# Patient Record
Sex: Female | Born: 1990 | Race: Black or African American | Hispanic: No | Marital: Single | Smoking: Never smoker
Health system: Southern US, Community
[De-identification: ages and names within clinical notes are randomized; demographics above are authoritative.]

---

## 2014-05-20 ENCOUNTER — Ambulatory Visit (INDEPENDENT_AMBULATORY_CARE_PROVIDER_SITE_OTHER): Payer: PRIVATE HEALTH INSURANCE | Admitting: Sports Medicine

## 2014-05-20 ENCOUNTER — Encounter: Payer: Self-pay | Admitting: Sports Medicine

## 2014-05-20 VITALS — BP 95/63 | HR 78 | Ht 71.0 in | Wt 210.0 lb

## 2014-05-20 DIAGNOSIS — M76829 Posterior tibial tendinitis, unspecified leg: Secondary | ICD-10-CM | POA: Insufficient documentation

## 2014-05-20 DIAGNOSIS — L505 Cholinergic urticaria: Secondary | ICD-10-CM | POA: Diagnosis not present

## 2014-05-20 DIAGNOSIS — M7631 Iliotibial band syndrome, right leg: Secondary | ICD-10-CM | POA: Diagnosis not present

## 2014-05-20 DIAGNOSIS — M2580 Other specified joint disorders, unspecified joint: Secondary | ICD-10-CM | POA: Diagnosis not present

## 2014-05-20 DIAGNOSIS — M6789 Other specified disorders of synovium and tendon, multiple sites: Secondary | ICD-10-CM

## 2014-05-20 DIAGNOSIS — M7630 Iliotibial band syndrome, unspecified leg: Secondary | ICD-10-CM | POA: Insufficient documentation

## 2014-05-20 NOTE — Progress Notes (Signed)
Lindsey PikesMedena Barraclough - 23 y.o. female MRN [id number]  Date of birth: 06/20/1991  CC & HPI:  [facility] for evaluation of: Bilateral (L>R) Foot Pain: Left greater than right medial ball of the foot over the sesamoid. Duration greater than 2 years but worsened over the past month since resuming basketball. No specific injury. Moderate to severe pain causing limping at home..  Denies numbness, tingling or significant weakness. She has tried Modified over-the-counter orthotics , therapeutic exercise, NSAIDs (now off due to rash) without significant relief. She presents today for fabrication of custom orthotics.  Right Knee Pain: Right lateral knee pain. 3 days, no injury "locked up after practice where we had to do multiple sprint repeats". Pain with palpation over the lateral aspect nonradiating. Bending and straightening her knee is painful..  Denies clicking, locking, giving way. Ice and therapeutic exercises and training room.  Skin Rash:  diffuse rash associated with heat exposure and exercise especially in warm environments. Has been treated throughout the season with Allegra, Singulair, H2 blocker. No known food triggers. Has recently been started on scopolamine patch. She's on been able to wear this for a day and reports overall less tingling sensation throughout the day with some improvement in symptoms during practice today but not sure if truly helpful at this point. No respiratory involvement in the past. No fevers, chills or weight loss. She has tried stopping anti-inflammatories which has not had an effect and above other medications do not seem to significantly help. No family history.  ROS:  Per HPI.   HISTORY: Past Medical, Surgical, Social, and Family History Reviewed & Updated per EMR.  Pertinent Historical Findings include:  otherwise healthy  [employer] basketball player    OBJECTIVE FINDINGS:  VS:   HT:5\' 11"  (180.3 cm)   WT:210 lb (95.255 kg)  BMI:29.4        BP:95/63 mmHg  HR:78bpm  TEMP: ( )  RESP:   PHYSICAL EXAM: GENERAL: adult athletic African American female. In no discomfort; no respiratory distress   PSYCH: alert and appropriate, good insight   NEURO: Sensation is intact to light touch in lower extremities   VASCULAR: No significant edema.  Skin:  no significant rash or urticarial lesions at this time. Scopolamine patch in place and right ear     Bilateral foot EXAM: Appearance:  overall normal-appearing. Collapse of longitudinal arch of the left foot with weightbearing. First toenail with small out of ecchymosis at the base.   Palpation: TTP over: Plantar aspect of the MCP joint over the sesamoid  No TTP over: Posterior tibialis or mid foot   Strength & ROM: 5/5 Strength and full active ROM in: Plantar flexion, dorsiflexion, inversion and eversion   Special Tests:  evidence of posterior tibialis insufficiency with repeat heel lifts. Calcaneal valgus at rest left greater than right    Right Knee EXAM: Appearance:  overall normal-appearing, normal alignment, no significant effusion.   Skin: No overlying erythema/ecchymosis.  Palpation: TTP over: Lateral femoral condyle increased tubercle.  No TTP over: Medial or lateral joint lines, patellar tendon.   Strength & ROM: 5/5 Strength and full active ROM in: Knee extension , knee flexion(slight lateral pain) Weakness in: Right hip abduction   Special Tests:  positive notable test. Ligamentously stable, normal McMurray's      ASSESSMENT: 1. Sesamoiditis   2. Posterior tibialis tendon insufficiency   3. Iliotibial band syndrome, right   4. Cholinergic urticaria    PROCEDURE NOTE: CUSTOM ORTHOTICS  The patient was fitted for a standard, cushioned, semi-rigid orthotic. The orthotic was heated & placed on the orthotic stand. The patient was positioned in subtalar neutral position and 10 of ankle dorsiflexion and weight bearing stance some heated orthotic blank. After completion of  the molding a stable paste was applied to the orthotic blank. The orthotic was ground to a stable position for weightbearing. The patient ambulated in these and reported they were comfortable without pressure spots.            BLANK:  Size 10 - Standard Cushioned             BASE:  Blue EVA      POSTINGS:  Sesmoid pad on Left + additional blue padding on entire left forefoot >50% of this 50 minute visit was spent in direct face to face evaluation, measurement and manufacture of custom molded orthotic.   PLAN: See problem based charting & AVS for additional documentation. - Custom Orthotics today with corrections as above. Reports improvement and discomfort and overall comfort orthotic  - Therapy exercises for left posterior tib def and [facility] ITB [facility]. - Continue Scopolamine patch and note effect with treatment. Consider Hyocyamine pre-activity/exposure given covered by insurance but concerned for sedative effects. Could consider withdrawal of Singulair as she reports no improvement on this. Also could consider transitioning from Allegra to Zyrtec twice a day (Zyrtec is derivative of Hydroxizine).  Additional last line treatment prior to referall to allergist would be qhs doxepin. Continue to watch for side effects/sedation. [facility].

## 2014-07-21 ENCOUNTER — Ambulatory Visit
Admission: RE | Admit: 2014-07-21 | Discharge: 2014-07-21 | Disposition: A | Discharge status: Home / Self Care | Payer: 59 | Source: Ambulatory Visit | Attending: Sports Medicine | Admitting: Sports Medicine

## 2014-07-21 ENCOUNTER — Telehealth: Payer: Self-pay | Admitting: Sports Medicine

## 2014-07-21 DIAGNOSIS — M545 Low back pain, unspecified: Secondary | ICD-10-CM

## 2014-07-21 NOTE — Telephone Encounter (Signed)
[facility]. [facility] after hours.

## 2014-09-29 ENCOUNTER — Ambulatory Visit (INDEPENDENT_AMBULATORY_CARE_PROVIDER_SITE_OTHER): Payer: 59 | Admitting: Women's Health

## 2014-09-29 ENCOUNTER — Other Ambulatory Visit (HOSPITAL_COMMUNITY)
Admission: RE | Admit: 2014-09-29 | Discharge: 2014-09-29 | Disposition: A | Discharge status: Home / Self Care | Payer: 59 | Source: Ambulatory Visit | Attending: Gynecology | Admitting: Gynecology

## 2014-09-29 ENCOUNTER — Encounter: Payer: Self-pay | Admitting: Women's Health

## 2014-09-29 VITALS — BP 126/80 | Ht 71.0 in | Wt 218.0 lb

## 2014-09-29 DIAGNOSIS — Z01411 Encounter for gynecological examination (general) (routine) with abnormal findings: Secondary | ICD-10-CM | POA: Insufficient documentation

## 2014-09-29 DIAGNOSIS — N926 Irregular menstruation, unspecified: Secondary | ICD-10-CM

## 2014-09-29 DIAGNOSIS — Z01419 Encounter for gynecological examination (general) (routine) without abnormal findings: Secondary | ICD-10-CM

## 2014-09-29 LAB — CBC WITH DIFFERENTIAL/PLATELET
BASOS ABS: 0 10*3/uL (ref 0.0–0.1)
BASOS PCT: 1 % (ref 0–1)
EOS ABS: 0.1 10*3/uL (ref 0.0–0.7)
Eosinophils Relative: 2 % (ref 0–5)
HEMATOCRIT: 39.3 % (ref 36.0–46.0)
Hemoglobin: 12.7 g/dL (ref 12.0–15.0)
Lymphocytes Relative: 44 % (ref 12–46)
Lymphs Abs: 1.8 10*3/uL (ref 0.7–4.0)
MCH: 28 pg (ref 26.0–34.0)
MCHC: 32.3 g/dL (ref 30.0–36.0)
MCV: 86.8 fL (ref 78.0–100.0)
MONO ABS: 0.5 10*3/uL (ref 0.1–1.0)
MPV: 9.3 fL (ref 8.6–12.4)
Monocytes Relative: 11 % (ref 3–12)
NEUTROS ABS: 1.7 10*3/uL (ref 1.7–7.7)
Neutrophils Relative %: 42 % — ABNORMAL LOW (ref 43–77)
PLATELETS: 258 10*3/uL (ref 150–400)
RBC: 4.53 MIL/uL (ref 3.87–5.11)
RDW: 14.6 % (ref 11.5–15.5)
WBC: 4.1 10*3/uL (ref 4.0–10.5)

## 2014-09-29 LAB — TSH: TSH: 1.934 u[IU]/mL (ref 0.350–4.500)

## 2014-09-29 NOTE — Progress Notes (Signed)
Lindsey PikesMedena Yoder 08/06/1991 [id number]    History:    Presents for annual exam.  Lesbian/same partner years/never female partner. Cycles every 2-3 months. Completed gardasil in high school.  Past medical history, past surgical history, family history and social history were all reviewed and documented in the EPIC chart. [employer] [place] athlete from [place]. Graduating in May planning to join [employer]. Parents healthy, grandmother diabetes.  ROS:  A ROS was performed and pertinent positives and negatives are included.  Exam:  Filed Vitals:   09/29/14 0919  BP: 126/80    General appearance:  Normal Thyroid:  Symmetrical, normal in size, without palpable masses or nodularity. Respiratory  Auscultation:  Clear without wheezing or rhonchi Cardiovascular  Auscultation:  Regular rate, without rubs, murmurs or gallops  Edema/varicosities:  Not grossly evident Abdominal  Soft,nontender, without masses, guarding or rebound.  Liver/spleen:  No organomegaly noted  Hernia:  None appreciated  Skin  Inspection:  Grossly normal   Breasts: Examined lying and sitting.     Right: Without masses, retractions, discharge or axillary adenopathy.     Left: Without masses, retractions, discharge or axillary adenopathy. Gentitourinary   Inguinal/mons:  Normal without inguinal adenopathy  External genitalia:  Normal  BUS/Urethra/Skene's glands:  Normal  Vagina:  Normal  Cervix:  Normal  Uterus:  normal in size, shape and contour.  Midline and mobile  Adnexa/parametria:     Rt: Without masses or tenderness.   Lt: Without masses or tenderness.  Anus and perineum: Normal  Digital rectal exam: Normal sphincter tone without palpated masses or tenderness  Assessment/Plan:  24 y.o. SBF G0 lesbian for annual exam.     Irregular cycles every 2-3 months Athlete  Plan: CBC, TSH, prolactin, UA, Pap. Options reviewed for irregular cycles, cycling with OCs declines. Reviewed irregular cycles may be  part of athletic triad. SBE's, continue regular exercise, healthy diet, calcium rich foods, MVI daily encouraged. Campus safety reviewed.   Harrington ChallengerYOUNG,NANCY J Adventhealth SebringWHNP, 10:29 AM 09/29/2014

## 2014-09-29 NOTE — Patient Instructions (Signed)
Health Maintenance Adopting a healthy lifestyle and getting preventive care can go a long way to promote health and wellness. Talk with your health care provider about what schedule of regular examinations is right for you. This is a good chance for you to check in with your provider about disease prevention and staying healthy. In between checkups, there are plenty of things you can do on your own. Experts have done a lot of research about which lifestyle changes and preventive measures are most likely to keep you healthy. Ask your health care provider for more information. WEIGHT AND DIET  Eat a healthy diet  Be sure to include plenty of vegetables, fruits, low-fat dairy products, and lean protein.  Do not eat a lot of foods high in solid fats, added sugars, or salt.  Get regular exercise. This is one of the most important things you can do for your health.  Most adults should exercise for at least 150 minutes each week. The exercise should increase your heart rate and make you sweat (moderate-intensity exercise).  Most adults should also do strengthening exercises at least twice a week. This is in addition to the moderate-intensity exercise.  Maintain a healthy weight  Body mass index (BMI) is a measurement that can be used to identify possible weight problems. It estimates body fat based on height and weight. Your health care provider can help determine your BMI and help you achieve or maintain a healthy weight.  For females 25 years of age and older:   A BMI below 18.5 is considered underweight.  A BMI of 18.5 to 24.9 is normal.  A BMI of 25 to 29.9 is considered overweight.  A BMI of 30 and above is considered obese.  Watch levels of cholesterol and blood lipids  You should start having your blood tested for lipids and cholesterol at 23 years of age, then have this test every 5 years.  You may need to have your cholesterol levels checked more often if:  Your lipid or  cholesterol levels are high.  You are older than 23 years of age.  You are at high risk for heart disease.  CANCER SCREENING   Lung Cancer  Lung cancer screening is recommended for adults 97-92 years old who are at high risk for lung cancer because of a history of smoking.  A yearly low-dose CT scan of the lungs is recommended for people who:  Currently smoke.  Have quit within the past 15 years.  Have at least a 30-pack-year history of smoking. A pack year is smoking an average of one pack of cigarettes a day for 1 year.  Yearly screening should continue until it has been 15 years since you quit.  Yearly screening should stop if you develop a health problem that would prevent you from having lung cancer treatment.  Breast Cancer  Practice breast self-awareness. This means understanding how your breasts normally appear and feel.  It also means doing regular breast self-exams. Let your health care provider know about any changes, no matter how small.  If you are in your 20s or 30s, you should have a clinical breast exam (CBE) by a health care provider every 1-3 years as part of a regular health exam.  If you are 76 or older, have a CBE every year. Also consider having a breast X-ray (mammogram) every year.  If you have a family history of breast cancer, talk to your health care provider about genetic screening.  If you are  at high risk for breast cancer, talk to your health care provider about having an MRI and a mammogram every year.  Breast cancer gene (BRCA) assessment is recommended for women who have family members with BRCA-related cancers. BRCA-related cancers include:  Breast.  Ovarian.  Tubal.  Peritoneal cancers.  Results of the assessment will determine the need for genetic counseling and BRCA1 and BRCA2 testing. Cervical Cancer Routine pelvic examinations to screen for cervical cancer are no longer recommended for nonpregnant women who are considered low  risk for cancer of the pelvic organs (ovaries, uterus, and vagina) and who do not have symptoms. A pelvic examination may be necessary if you have symptoms including those associated with pelvic infections. Ask your health care provider if a screening pelvic exam is right for you.   The Pap test is the screening test for cervical cancer for women who are considered at risk.  If you had a hysterectomy for a problem that was not cancer or a condition that could lead to cancer, then you no longer need Pap tests.  If you are older than 65 years, and you have had normal Pap tests for the past 10 years, you no longer need to have Pap tests.  If you have had past treatment for cervical cancer or a condition that could lead to cancer, you need Pap tests and screening for cancer for at least 20 years after your treatment.  If you no longer get a Pap test, assess your risk factors if they change (such as having a new sexual partner). This can affect whether you should start being screened again.  Some women have medical problems that increase their chance of getting cervical cancer. If this is the case for you, your health care provider may recommend more frequent screening and Pap tests.  The human papillomavirus (HPV) test is another test that may be used for cervical cancer screening. The HPV test looks for the virus that can cause cell changes in the cervix. The cells collected during the Pap test can be tested for HPV.  The HPV test can be used to screen women 30 years of age and older. Getting tested for HPV can extend the interval between normal Pap tests from three to five years.  An HPV test also should be used to screen women of any age who have unclear Pap test results.  After 23 years of age, women should have HPV testing as often as Pap tests.  Colorectal Cancer  This type of cancer can be detected and often prevented.  Routine colorectal cancer screening usually begins at 23 years of  age and continues through 23 years of age.  Your health care provider may recommend screening at an earlier age if you have risk factors for colon cancer.  Your health care provider may also recommend using home test kits to check for hidden blood in the stool.  A small camera at the end of a tube can be used to examine your colon directly (sigmoidoscopy or colonoscopy). This is done to check for the earliest forms of colorectal cancer.  Routine screening usually begins at age 50.  Direct examination of the colon should be repeated every 5-10 years through 23 years of age. However, you may need to be screened more often if early forms of precancerous polyps or small growths are found. Skin Cancer  Check your skin from head to toe regularly.  Tell your health care provider about any new moles or changes in   moles, especially if there is a change in a mole's shape or color.  Also tell your health care provider if you have a mole that is larger than the size of a pencil eraser.  Always use sunscreen. Apply sunscreen liberally and repeatedly throughout the day.  Protect yourself by wearing long sleeves, pants, a wide-brimmed hat, and sunglasses whenever you are outside. HEART DISEASE, DIABETES, AND HIGH BLOOD PRESSURE   Have your blood pressure checked at least every 1-2 years. High blood pressure causes heart disease and increases the risk of stroke.  If you are between 75 years and 42 years old, ask your health care provider if you should take aspirin to prevent strokes.  Have regular diabetes screenings. This involves taking a blood sample to check your fasting blood sugar level.  If you are at a normal weight and have a low risk for diabetes, have this test once every three years after 23 years of age.  If you are overweight and have a high risk for diabetes, consider being tested at a younger age or more often. PREVENTING INFECTION  Hepatitis B  If you have a higher risk for  hepatitis B, you should be screened for this virus. You are considered at high risk for hepatitis B if:  You were born in a country where hepatitis B is common. Ask your health care provider which countries are considered high risk.  Your parents were born in a high-risk country, and you have not been immunized against hepatitis B (hepatitis B vaccine).  You have HIV or AIDS.  You use needles to inject street drugs.  You live with someone who has hepatitis B.  You have had sex with someone who has hepatitis B.  You get hemodialysis treatment.  You take certain medicines for conditions, including cancer, organ transplantation, and autoimmune conditions. Hepatitis C  Blood testing is recommended for:  Everyone born from [birth year] through 1964.  Anyone with known risk factors for hepatitis C. Sexually transmitted infections (STIs)  You should be screened for sexually transmitted infections (STIs) including gonorrhea and chlamydia if:  You are sexually active and are younger than 23 years of age.  You are older than 23 years of age and your health care provider tells you that you are at risk for this type of infection.  Your sexual activity has changed since you were last screened and you are at an increased risk for chlamydia or gonorrhea. Ask your health care provider if you are at risk.  If you do not have HIV, but are at risk, it may be recommended that you take a prescription medicine daily to prevent HIV infection. This is called pre-exposure prophylaxis (PrEP). You are considered at risk if:  You are sexually active and do not regularly use condoms or know the HIV status of your partner(s).  You take drugs by injection.  You are sexually active with a partner who has HIV. Talk with your health care provider about whether you are at high risk of being infected with HIV. If you choose to begin PrEP, you should first be tested for HIV. You should then be tested every 3 months for  as long as you are taking PrEP.  PREGNANCY   If you are premenopausal and you may become pregnant, ask your health care provider about preconception counseling.  If you may become pregnant, take 400 to 800 micrograms (mcg) of folic acid every day.  If you want to prevent pregnancy, talk to your  health care provider about birth control (contraception). OSTEOPOROSIS AND MENOPAUSE   Osteoporosis is a disease in which the bones lose minerals and strength with aging. This can result in serious bone fractures. Your risk for osteoporosis can be identified using a bone density scan.  If you are 80 years of age or older, or if you are at risk for osteoporosis and fractures, ask your health care provider if you should be screened.  Ask your health care provider whether you should take a calcium or vitamin D supplement to lower your risk for osteoporosis.  Menopause may have certain physical symptoms and risks.  Hormone replacement therapy may reduce some of these symptoms and risks. Talk to your health care provider about whether hormone replacement therapy is right for you.  HOME CARE INSTRUCTIONS   Schedule regular health, dental, and eye exams.  Stay current with your immunizations.   Do not use any tobacco products including cigarettes, chewing tobacco, or electronic cigarettes.  If you are pregnant, do not drink alcohol.  If you are breastfeeding, limit how much and how often you drink alcohol.  Limit alcohol intake to no more than 1 drink per day for nonpregnant women. One drink equals 12 ounces of beer, 5 ounces of wine, or 1 ounces of hard liquor.  Do not use street drugs.  Do not share needles.  Ask your health care provider for help if you need support or information about quitting drugs.  Tell your health care provider if you often feel depressed.  Tell your health care provider if you have ever been abused or do not feel safe at home. Document Released: 02/06/2011  Document Revised: 12/08/2013 Document Reviewed: 06/25/2013 [facility] Patient Information 2015 [place], [place]. This information is not intended to replace advice given to you by your health care provider. Make sure you discuss any questions you have with your health care provider. Polycystic Ovarian Syndrome Polycystic ovarian syndrome (PCOS) is a common hormonal disorder among women of reproductive age. Most women with PCOS grow many small cysts on their ovaries. PCOS can cause problems with your periods and make it difficult to get pregnant. It can also cause an increased risk of miscarriage with pregnancy. If left untreated, PCOS can lead to serious health problems, such as diabetes and heart disease. CAUSES The cause of PCOS is not fully understood, but genetics may be a factor. SIGNS AND SYMPTOMS   Infrequent or no menstrual periods.   Inability to get pregnant (infertility) because of not ovulating.   Increased growth of hair on the face, chest, stomach, back, thumbs, thighs, or toes.   Acne, oily skin, or dandruff.   Pelvic pain.   Weight gain or obesity, usually carrying extra weight around the waist.   Type 2 diabetes.   High cholesterol.   High blood pressure.   Female-pattern baldness or thinning hair.   Patches of thickened and dark brown or black skin on the neck, arms, breasts, or thighs.   Tiny excess flaps of skin (skin tags) in the armpits or neck area.   Excessive snoring and having breathing stop at times while asleep (sleep apnea).   Deepening of the voice.   Gestational diabetes when pregnant.  DIAGNOSIS  There is no single test to diagnose PCOS.   Your health care provider will:   Take a medical history.   Perform a pelvic exam.   Have ultrasonography done.   Check your female and female hormone levels.   Measure glucose or sugar  levels in the blood.   Do other blood tests.   If you are producing too many female hormones,  your health care provider will make sure it is from PCOS. At the physical exam, your health care provider will want to evaluate the areas of increased hair growth. Try to allow natural hair growth for a few days before the visit.   During a pelvic exam, the ovaries may be enlarged or swollen because of the increased number of small cysts. This can be seen more easily by using vaginal ultrasonography or screening to examine the ovaries and lining of the uterus (endometrium) for cysts. The uterine lining may become thicker if you have not been having a regular period.  TREATMENT  Because there is no cure for PCOS, it needs to be managed to prevent problems. Treatments are based on your symptoms. Treatment is also based on whether you want to have a baby or whether you need contraception.  Treatment may include:   Progesterone hormone to start a menstrual period.   Birth control pills to make you have regular menstrual periods.   Medicines to make you ovulate, if you want to get pregnant.   Medicines to control your insulin.   Medicine to control your blood pressure.   Medicine and diet to control your high cholesterol and triglycerides in your blood.  Medicine to reduce excessive hair growth.  Surgery, making small holes in the ovary, to decrease the amount of female hormone production. This is done through a long, lighted tube (laparoscope) placed into the pelvis through a tiny incision in the lower abdomen.  HOME CARE INSTRUCTIONS  Only take over-the-counter or prescription medicine as directed by your health care provider.  Pay attention to the foods you eat and your activity levels. This can help reduce the effects of PCOS.  Keep your weight under control.  Eat foods that are low in carbohydrate and high in fiber.  Exercise regularly. SEEK MEDICAL CARE IF:  Your symptoms do not get better with medicine.  You have new symptoms. Document Released: 11/17/2004 Document  Revised: 05/14/2013 Document Reviewed: 01/09/2013 [facility] Patient Information 2015 [place], [place]. This information is not intended to replace advice given to you by your health care provider. Make sure you discuss any questions you have with your health care provider.

## 2014-09-30 LAB — CYTOLOGY - PAP

## 2014-09-30 LAB — PROLACTIN: PROLACTIN: 9.7 ng/mL

## 2014-10-05 ENCOUNTER — Other Ambulatory Visit: Payer: Self-pay | Admitting: Orthopedic Surgery

## 2014-10-05 DIAGNOSIS — M25561 Pain in right knee: Secondary | ICD-10-CM

## 2014-10-12 ENCOUNTER — Other Ambulatory Visit: Payer: 59

## 2014-10-19 ENCOUNTER — Other Ambulatory Visit: Payer: 59

## 2014-10-20 ENCOUNTER — Ambulatory Visit
Admission: RE | Admit: 2014-10-20 | Discharge: 2014-10-20 | Disposition: A | Discharge status: Home / Self Care | Payer: 59 | Source: Ambulatory Visit | Attending: Orthopedic Surgery | Admitting: Orthopedic Surgery

## 2014-10-20 DIAGNOSIS — M25561 Pain in right knee: Secondary | ICD-10-CM

## 2016-07-10 IMAGING — MR MR KNEE*R* W/O CM
5 of 6 series · 30 of 40 positions shown · non-contrast
Comparison: None.

CLINICAL DATA: Basketball injury right knee 1 month ago with
continued lateral knee pain. Question meniscal tear.

EXAM:
MRI OF THE RIGHT KNEE WITHOUT CONTRAST
TECHNIQUE: Multiplanar, multisequence MR imaging of the knee was performed. No
intravenous contrast was administered.

[Series 3: PD · axial · 4.0mm · 0.62mm/px · z∈[-44,+66]mm · 9 of 24 slices shown (1 of 2)]
[im 1/24]
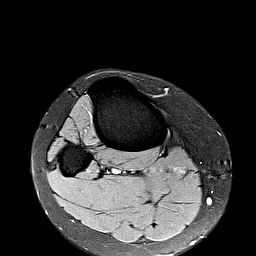
[im 3/24]
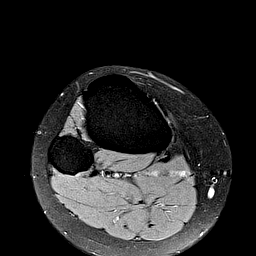
[im 6/24]
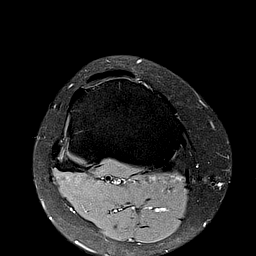
[im 9/24]
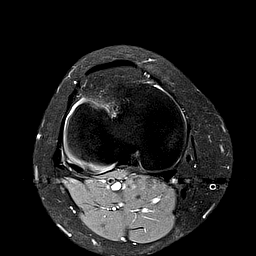
[im 12/24]
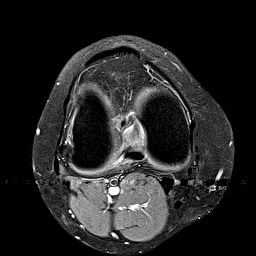
[im 15/24]
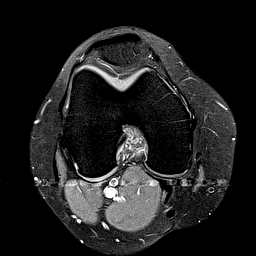
[im 18/24]
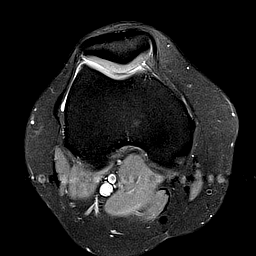
[im 21/24]
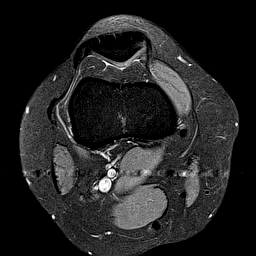
[im 24/24]
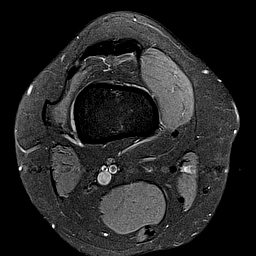

[Series 4: PD · coronal · 4.0mm · 0.33mm/px · 6 of 20 slices shown (2 of 2)]
[im 1/20]
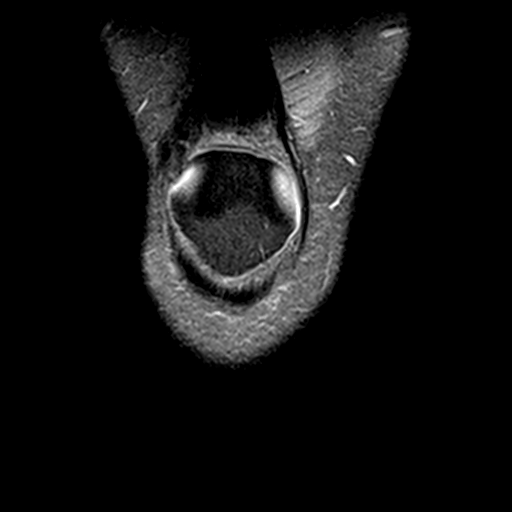
[im 4/20]
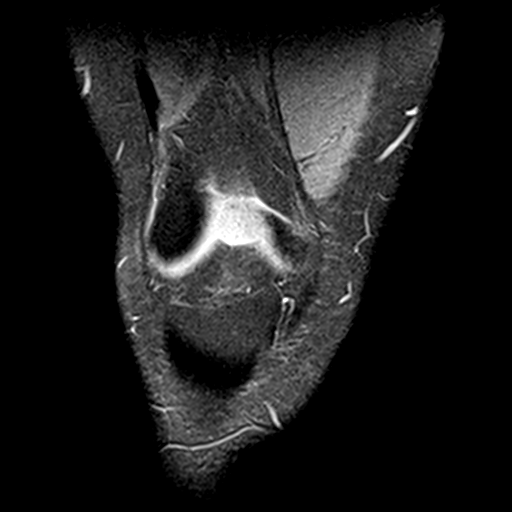
[im 8/20]
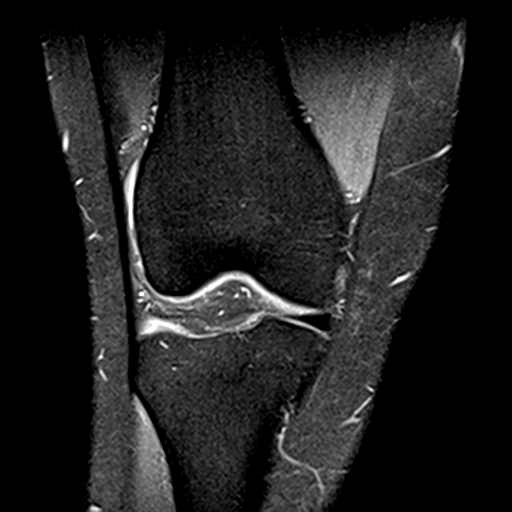
[im 12/20]
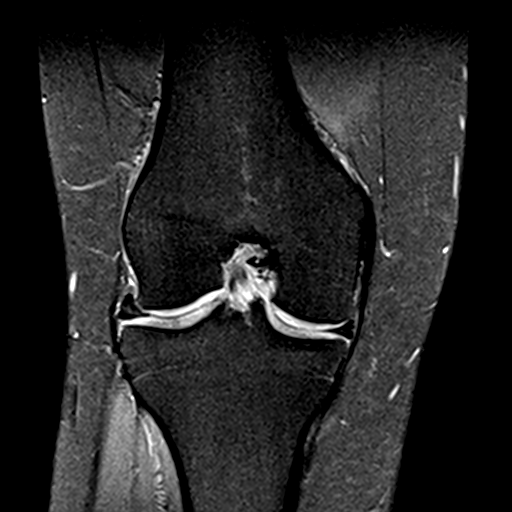
[im 16/20]
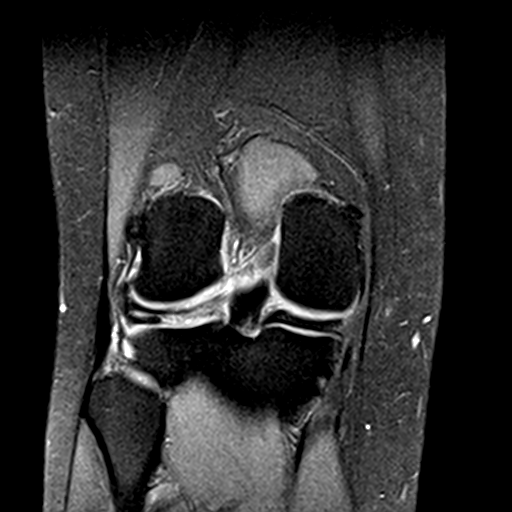
[im 20/20]
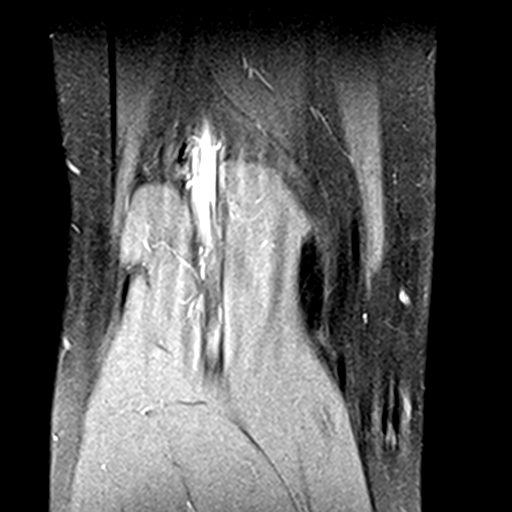

[Series 5: PD fat-sat · sagittal · 4.0mm · 0.62mm/px · 7 of 22 slices shown]
[im 1/22]
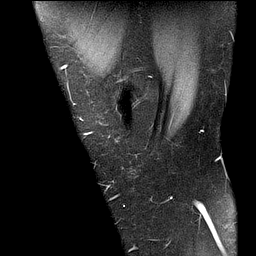
[im 4/22]
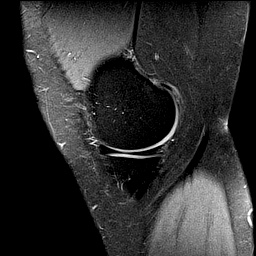
[im 8/22]
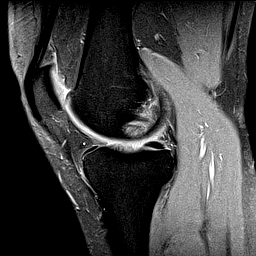
[im 11/22]
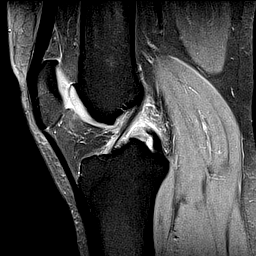
[im 15/22]
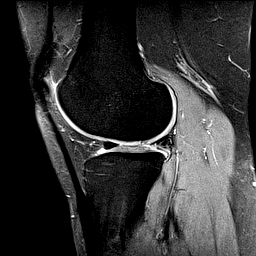
[im 18/22]
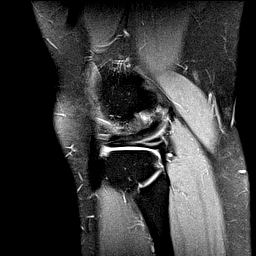
[im 22/22]
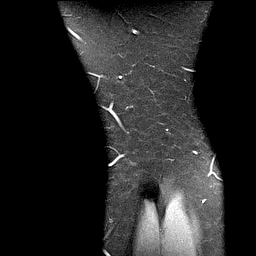

[Series 6: (id) · coronal · 4.0mm · 0.33mm/px · 2 of 20 slices shown]
[im 1/20]
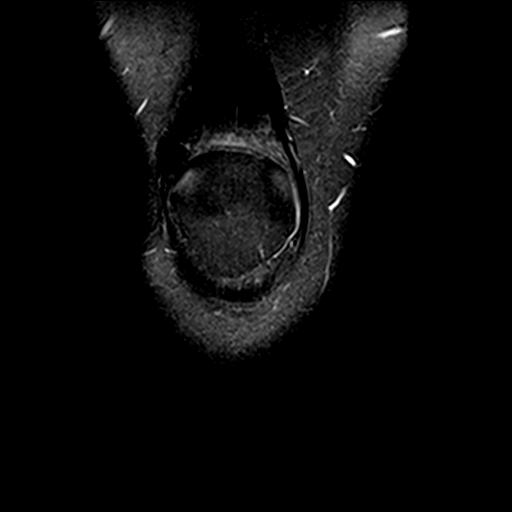
[im 4/20]
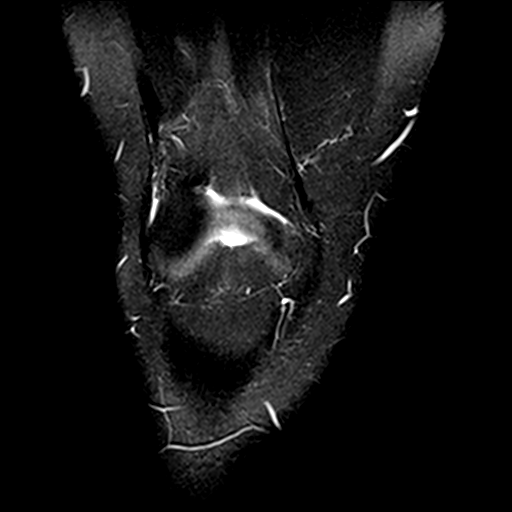

[Series 7: T1 · coronal · 4.0mm · 0.33mm/px · 6 of 20 slices shown]
[im 1/20]
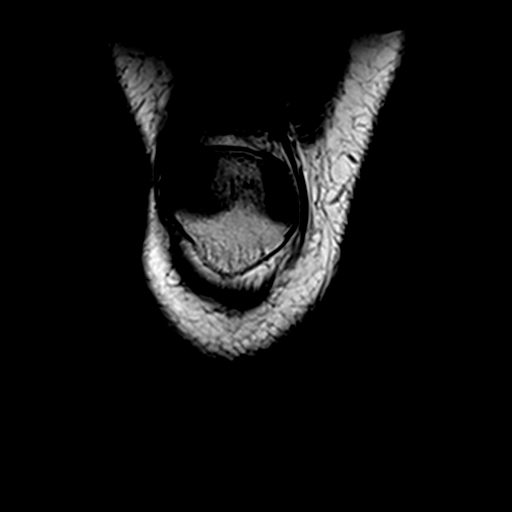
[im 4/20]
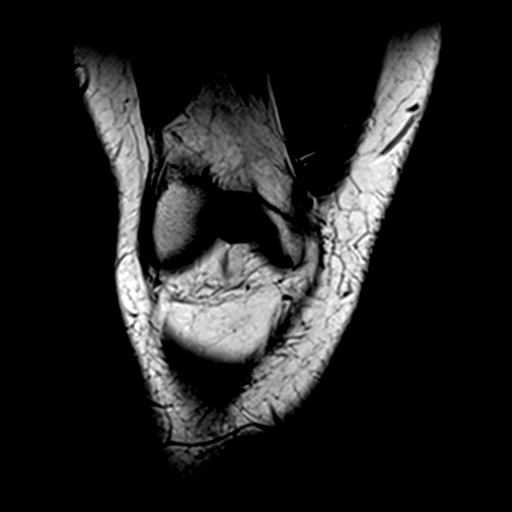
[im 8/20]
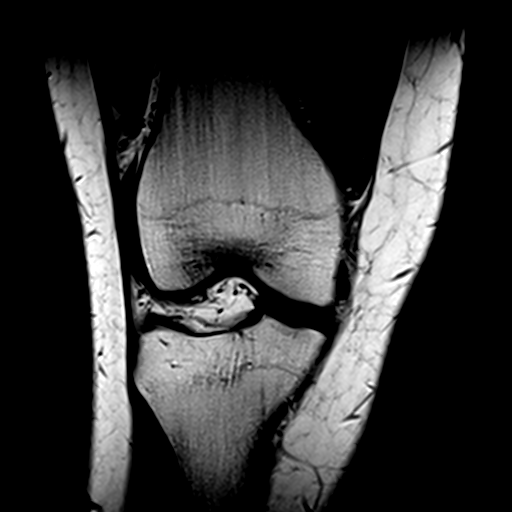
[im 12/20]
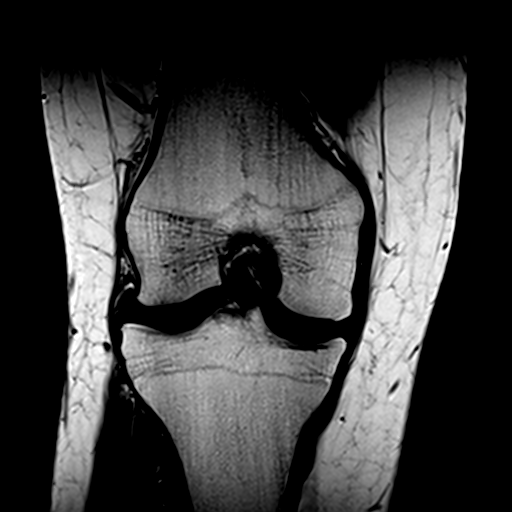
[im 16/20]
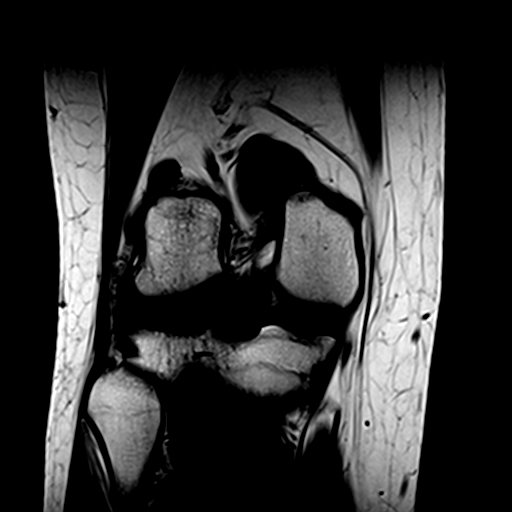
[im 20/20]
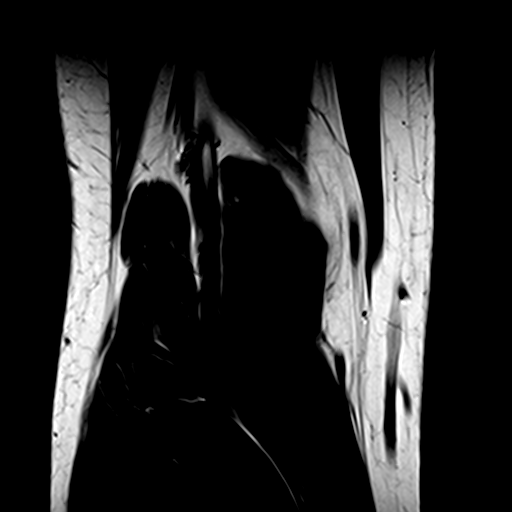

[30 of 40 positions shown; findings below may reference images not displayed]

FINDINGS: MENISCI

Medial meniscus:  Intact.

Lateral meniscus: The patient has a large complex tear of the
posterior horn of the lateral meniscus. Tear at the meniscal root
has a radial orientation. The tear extends into the posterior horn
reaching both the femoral and tibial articular surfaces. No
centrally displaced fragment is identified.

LIGAMENTS

Cruciates:  Intact.

Collaterals:  Intact.

CARTILAGE

Patellofemoral:  Unremarkable.

Medial:  Unremarkable.

Lateral: There is a focal defect in hyaline cartilage of the
posterior aspect of the weight-bearing surface of the lateral
femoral condyle measuring 0.5 cm AP x 0.7 cm transverse. The defect
is near full-thickness without underlying marrow signal change.

Joint:  No joint effusion.

Popliteal Fossa:  No Baker's cyst.

Extensor Mechanism:  Intact.

Bones:  Normal marrow signal throughout.
IMPRESSION: Complex tear posterior horn lateral meniscus as described above.

Small, focal defect in hyaline cartilage of the posterior
weight-bearing surface of the lateral femoral condyle.
